# Patient Record
Sex: Male | Born: 2000 | Race: White | Hispanic: No | Marital: Single | Smoking: Never smoker
Health system: Southern US, Community
[De-identification: ages and names within clinical notes are randomized; demographics above are authoritative.]

---
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
## 2019-07-02 ENCOUNTER — Ambulatory Visit (HOSPITAL_COMMUNITY)
Admission: EM | Admit: 2019-07-02 | Discharge: 2019-07-02 | Disposition: A | Discharge status: Home / Self Care | Payer: PRIVATE HEALTH INSURANCE | Attending: Family Medicine | Admitting: Family Medicine

## 2019-07-02 ENCOUNTER — Other Ambulatory Visit: Payer: Self-pay

## 2019-07-02 ENCOUNTER — Encounter (HOSPITAL_COMMUNITY): Payer: Self-pay | Admitting: Emergency Medicine

## 2019-07-02 DIAGNOSIS — M5441 Lumbago with sciatica, right side: Secondary | ICD-10-CM | POA: Diagnosis not present

## 2019-07-02 LAB — POCT URINALYSIS DIP (DEVICE)
Glucose, UA: NEGATIVE mg/dL
Hgb urine dipstick: NEGATIVE
Ketones, ur: NEGATIVE mg/dL
Leukocytes,Ua: NEGATIVE
Nitrite: NEGATIVE
Protein, ur: NEGATIVE mg/dL
Specific Gravity, Urine: >=1.03 (ref 1.005–1.030)
Urobilinogen, UA: 0.2 mg/dL (ref 0.0–1.0)
pH: 6 (ref 5.0–8.0)

## 2019-07-02 MED ORDER — HYDROCODONE-ACETAMINOPHEN 5-325 MG PO TABS: 1.0000 | ORAL_TABLET | Freq: Four times a day (QID) | ORAL | 0 refills | Status: AC | PRN

## 2019-07-02 MED ORDER — TIZANIDINE HCL 4 MG PO TABS
4.0000 mg | ORAL_TABLET | Freq: Four times a day (QID) | ORAL | 0 refills | Status: AC | PRN
Start: 2019-07-02 — End: ?

## 2019-07-02 MED ORDER — METHYLPREDNISOLONE 4 MG PO TBPK: ORAL_TABLET | ORAL | 0 refills | Status: AC

## 2019-07-02 NOTE — ED Triage Notes (Addendum)
Low back pain, now concentrated in right flank and radiating down right leg .  Denies urinary issue, but admits to dark color of urine.    Back started hurting last night.

## 2019-07-02 NOTE — Discharge Instructions (Signed)
Activity as tolerated.  Avoid bedrest Ice or heat to low back Take the prednisone as directed, take all of day one today Take the muscle relaxer as needed Take the pain medicine as needed.  Do not drive on pain pills Be careful with your movements

## 2019-07-02 NOTE — ED Provider Notes (Signed)
[facility]    CSN: [id number] Arrival date & time: 07/02/19  1531      History   Chief Complaint Chief Complaint  Patient presents with  . Flank Pain    HPI Jeff Bautista is a 18 y.o. male.   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  He states that yesterday towards the end of his shift he started having some low back pain.  It was mostly in the right low back.  He went home and his girlfriend massaged the area.  He tried to rest.  He got up this morning his back was still painful.  He took ibuprofen.  It improved.  As the day progressed, however, he had increasing back pain.  Hurts with movement.  Better with rest.  Pain is in the right low back.  Goes down the right posterior thigh to about the calf.  No numbness.  No weakness.  No bowel or bladder complaint.  No accident or injury.  No overuse that is recalled, last time I did heavy lifting was almost a week ago.  No fall or injury.  No prior history of back problems.  History reviewed. No pertinent past medical history.  There are no active problems to display for this patient. Morbid obesity  History reviewed. No pertinent surgical history.     Home Medications    Prior to Admission medications   Medication Sig Start Date End Date Taking? Authorizing Provider  HYDROcodone-acetaminophen (NORCO/VICODIN) 5-325 MG tablet Take 1-2 tablets by mouth every 6 (six) hours as needed. 07/02/19   Eustace MooreNelson, Diora Bellizzi Sue, MD  methylPREDNISolone (MEDROL DOSEPAK) 4 MG TBPK tablet tad 07/02/19   Eustace MooreNelson, Bocephus Cali Sue, MD  tiZANidine (ZANAFLEX) 4 MG tablet Take 1-2 tablets (4-8 mg total) by mouth every 6 (six) hours as needed for muscle spasms. 07/02/19   Eustace MooreNelson, Leldon Steege Sue, MD    Family History Family History  Problem Relation Age of Onset  . Hypertension Mother   . Hypertension Father   . Diabetes Father     Social History Social History   Tobacco Use  . Smoking status: Never Smoker   Substance Use Topics  . Alcohol use: Never    Frequency: Never  . Drug use: Never     Allergies   Patient has no known allergies.   Review of Systems Review of Systems  Constitutional: Negative for chills and fever.  HENT: Negative for ear pain and sore throat.   Eyes: Negative for pain and visual disturbance.  Respiratory: Negative for cough and shortness of breath.   Cardiovascular: Negative for chest pain and palpitations.  Gastrointestinal: Negative for abdominal pain and vomiting.  Genitourinary: Negative for dysuria and hematuria.  Musculoskeletal: Positive for back pain. Negative for arthralgias.  Skin: Negative for color change and rash.  Neurological: Negative for seizures and syncope.  All other systems reviewed and are negative.    Physical Exam Triage Vital Signs ED Triage Vitals  Enc Vitals Group     BP 07/02/19 1620 (!) 142/85     Pulse Rate 07/02/19 1620 88     Resp 07/02/19 1620 (!) 22     Temp 07/02/19 1620 98.7 F (37.1 C)     Temp Source 07/02/19 1620 Oral     SpO2 07/02/19 1620 99 %     Weight --      Height --      Head Circumference --  Peak Flow --      Pain Score 07/02/19 1616 8     Pain Loc --      Pain Edu? --      Excl. in [place]? --    No data found.  Updated Vital Signs BP (!) 142/85 (BP Location: Right Arm) Comment (BP Location): large cuff, forearm  Pulse 88   Temp 98.7 F (37.1 C) (Oral)   Resp (!) 22   SpO2 99%   Visual Acuity Right Eye Distance:   Left Eye Distance:   Bilateral Distance:    Right Eye Near:   Left Eye Near:    Bilateral Near:     Physical Exam Constitutional:      General: He is not in acute distress.    Appearance: He is well-developed. He is obese.     Comments: Super obese  HENT:     Head: Normocephalic and atraumatic.     Mouth/Throat:     Comments: Mask in place Eyes:     Conjunctiva/sclera: Conjunctivae normal.     Pupils: Pupils are equal, round, and reactive to light.  Neck:      Musculoskeletal: Normal range of motion.  Cardiovascular:     Rate and Rhythm: Normal rate and regular rhythm.     Heart sounds: Normal heart sounds.  Pulmonary:     Effort: Pulmonary effort is normal. No respiratory distress.     Breath sounds: Normal breath sounds.  Abdominal:     General: There is no distension.     Palpations: Abdomen is soft.     Tenderness: There is no right CVA tenderness or left CVA tenderness.  Musculoskeletal: Normal range of motion.     Comments: Very mild tenderness in the right SI region.  No palpable muscle spasm.  Patient is very guarded and reluctant to perform range of motion testing.  He can stand on his heels and toes.  Reflexes are intact.  Straight leg raise at full right leg extension causes increase in posterior thigh pain.  Skin:    General: Skin is warm and dry.  Neurological:     Mental Status: He is alert.      UC Treatments / Results  Labs (all labs ordered are listed, but only abnormal results are displayed) Labs Reviewed  POCT URINALYSIS DIP (DEVICE) - Abnormal; Notable for the following components:      Result Value   Bilirubin Urine SMALL (*)    All other components within normal limits    EKG   Radiology No results found.  Procedures Procedures (including critical care time)  Medications Ordered in UC Medications - No data to display  Initial Impression / Assessment and Plan / UC Course  I have reviewed the triage vital signs and the nursing notes.  Pertinent labs & imaging results that were available during my care of the patient were reviewed by me and considered in my medical decision making (see chart for details).     Back pain with radiculopathy.  No symptoms of concern.  We will treat conservatively, have him follow-up with his primary care doctor if fails to improve.  Primary care referral is given Final Clinical Impressions(s) / UC Diagnoses   Final diagnoses:  Acute right-sided low back pain with  right-sided sciatica     Discharge Instructions     Activity as tolerated.  Avoid bedrest Ice or heat to low back Take the prednisone as directed, take all of day one today Take the  muscle relaxer as needed Take the pain medicine as needed.  Do not drive on pain pills Be careful with your movements   ED Prescriptions    Medication Sig Dispense Auth. Provider   methylPREDNISolone (MEDROL DOSEPAK) 4 MG TBPK tablet tad 21 tablet Eustace Moore, MD   tiZANidine (ZANAFLEX) 4 MG tablet Take 1-2 tablets (4-8 mg total) by mouth every 6 (six) hours as needed for muscle spasms. 21 tablet Eustace Moore, MD   HYDROcodone-acetaminophen (NORCO/VICODIN) 5-325 MG tablet Take 1-2 tablets by mouth every 6 (six) hours as needed. 10 tablet Eustace Moore, MD     I have reviewed the PDMP during this encounter.   Eustace Moore, MD 07/02/19 Jerene Bears
